# Patient Record
Sex: Female | Born: 1960 | Race: White | Hispanic: No | Marital: Married | Smoking: Current every day smoker
Health system: Southern US, Community
[De-identification: ages and names within clinical notes are randomized; demographics above are authoritative.]

## PROBLEM LIST (undated history)

## (undated) DIAGNOSIS — F329 Major depressive disorder, single episode, unspecified: Secondary | ICD-10-CM

## (undated) DIAGNOSIS — F32A Depression, unspecified: Secondary | ICD-10-CM

## (undated) DIAGNOSIS — M1990 Unspecified osteoarthritis, unspecified site: Secondary | ICD-10-CM

## (undated) DIAGNOSIS — E039 Hypothyroidism, unspecified: Secondary | ICD-10-CM

## (undated) DIAGNOSIS — K219 Gastro-esophageal reflux disease without esophagitis: Secondary | ICD-10-CM

## (undated) HISTORY — DX: Hypothyroidism, unspecified: E03.9

## (undated) HISTORY — DX: Gastro-esophageal reflux disease without esophagitis: K21.9

## (undated) HISTORY — DX: Depression, unspecified: F32.A

## (undated) HISTORY — PX: TUBAL LIGATION: SHX77

## (undated) HISTORY — PX: COLONOSCOPY: SHX174

## (undated) HISTORY — DX: Major depressive disorder, single episode, unspecified: F32.9

## (undated) HISTORY — PX: UPPER GASTROINTESTINAL ENDOSCOPY: SHX188

---

## 1998-08-10 ENCOUNTER — Other Ambulatory Visit: Admission: RE | Admit: 1998-08-10 | Discharge: 1998-08-10 | Payer: Self-pay | Admitting: Gynecology

## 1999-08-23 ENCOUNTER — Other Ambulatory Visit: Admission: RE | Admit: 1999-08-23 | Discharge: 1999-08-23 | Payer: Self-pay | Admitting: Gynecology

## 1999-08-24 ENCOUNTER — Encounter: Admission: RE | Admit: 1999-08-24 | Discharge: 1999-08-24 | Payer: Self-pay | Admitting: Gynecology

## 2000-08-25 ENCOUNTER — Other Ambulatory Visit: Admission: RE | Admit: 2000-08-25 | Discharge: 2000-08-25 | Payer: Self-pay | Admitting: Gynecology

## 2000-12-02 ENCOUNTER — Other Ambulatory Visit: Admission: RE | Admit: 2000-12-02 | Discharge: 2000-12-02 | Payer: Self-pay | Admitting: Gynecology

## 2001-02-04 ENCOUNTER — Encounter: Admission: RE | Admit: 2001-02-04 | Discharge: 2001-02-04 | Payer: Self-pay | Admitting: Gynecology

## 2001-02-04 ENCOUNTER — Encounter: Payer: Self-pay | Admitting: Gynecology

## 2001-10-13 ENCOUNTER — Other Ambulatory Visit: Admission: RE | Admit: 2001-10-13 | Discharge: 2001-10-13 | Payer: Self-pay | Admitting: Gynecology

## 2002-10-18 ENCOUNTER — Other Ambulatory Visit: Admission: RE | Admit: 2002-10-18 | Discharge: 2002-10-18 | Payer: Self-pay | Admitting: Gynecology

## 2003-10-20 ENCOUNTER — Other Ambulatory Visit: Admission: RE | Admit: 2003-10-20 | Discharge: 2003-10-20 | Payer: Self-pay | Admitting: Gynecology

## 2003-11-02 ENCOUNTER — Encounter: Admission: RE | Admit: 2003-11-02 | Discharge: 2003-11-02 | Payer: Self-pay | Admitting: Gynecology

## 2004-04-23 ENCOUNTER — Other Ambulatory Visit: Admission: RE | Admit: 2004-04-23 | Discharge: 2004-04-23 | Payer: Self-pay | Admitting: Gynecology

## 2004-11-08 ENCOUNTER — Other Ambulatory Visit: Admission: RE | Admit: 2004-11-08 | Discharge: 2004-11-08 | Payer: Self-pay | Admitting: Gynecology

## 2004-11-23 ENCOUNTER — Encounter: Admission: RE | Admit: 2004-11-23 | Discharge: 2004-11-23 | Payer: Self-pay | Admitting: Gynecology

## 2005-05-15 ENCOUNTER — Other Ambulatory Visit: Admission: RE | Admit: 2005-05-15 | Discharge: 2005-05-15 | Payer: Self-pay | Admitting: Gynecology

## 2005-11-25 ENCOUNTER — Other Ambulatory Visit: Admission: RE | Admit: 2005-11-25 | Discharge: 2005-11-25 | Payer: Self-pay | Admitting: Gynecology

## 2005-12-02 ENCOUNTER — Encounter: Admission: RE | Admit: 2005-12-02 | Discharge: 2005-12-02 | Payer: Self-pay | Admitting: Gynecology

## 2006-06-02 ENCOUNTER — Other Ambulatory Visit: Admission: RE | Admit: 2006-06-02 | Discharge: 2006-06-02 | Payer: Self-pay | Admitting: Gynecology

## 2006-07-17 ENCOUNTER — Ambulatory Visit: Payer: Self-pay | Admitting: Gastroenterology

## 2006-07-23 ENCOUNTER — Ambulatory Visit: Payer: Self-pay | Admitting: Gastroenterology

## 2006-08-22 ENCOUNTER — Ambulatory Visit: Payer: Self-pay | Admitting: Gastroenterology

## 2006-12-11 ENCOUNTER — Encounter: Admission: RE | Admit: 2006-12-11 | Discharge: 2006-12-11 | Payer: Self-pay | Admitting: Gynecology

## 2007-06-10 ENCOUNTER — Other Ambulatory Visit: Admission: RE | Admit: 2007-06-10 | Discharge: 2007-06-10 | Payer: Self-pay | Admitting: Gynecology

## 2008-01-04 ENCOUNTER — Encounter: Admission: RE | Admit: 2008-01-04 | Discharge: 2008-01-04 | Payer: Self-pay | Admitting: Gynecology

## 2008-07-12 ENCOUNTER — Telehealth: Payer: Self-pay | Admitting: Gastroenterology

## 2008-07-13 ENCOUNTER — Ambulatory Visit: Payer: Self-pay | Admitting: Gastroenterology

## 2009-02-24 ENCOUNTER — Encounter: Admission: RE | Admit: 2009-02-24 | Discharge: 2009-02-24 | Payer: Self-pay | Admitting: Gynecology

## 2010-03-06 ENCOUNTER — Encounter: Admission: RE | Admit: 2010-03-06 | Discharge: 2010-03-06 | Payer: Self-pay | Admitting: Gynecology

## 2011-02-05 ENCOUNTER — Other Ambulatory Visit: Payer: Self-pay | Admitting: Gynecology

## 2011-02-05 DIAGNOSIS — Z1231 Encounter for screening mammogram for malignant neoplasm of breast: Secondary | ICD-10-CM

## 2011-03-08 ENCOUNTER — Ambulatory Visit
Admission: RE | Admit: 2011-03-08 | Discharge: 2011-03-08 | Disposition: A | Discharge status: Home / Self Care | Payer: PRIVATE HEALTH INSURANCE | Source: Ambulatory Visit | Attending: Gynecology | Admitting: Gynecology

## 2011-03-08 DIAGNOSIS — Z1231 Encounter for screening mammogram for malignant neoplasm of breast: Secondary | ICD-10-CM

## 2011-03-29 NOTE — Assessment & Plan Note (Signed)
[facility]                           GASTROENTEROLOGY OFFICE NOTE   NAME:Hambly, KENITA BINES                  MRN:          [id number]  DATE:08/22/2006                            DOB:          Dec 06, 1960    Ms. Frink has had complete relief of her symptoms since starting  metoclopramide and taking Prevacid twice a day. She has no gastrointestinal  complaints today.   CURRENT MEDICATIONS:  As listed on the chart;  updated and reviewed.   MEDICATION ALLERGIES:  None known.   PHYSICAL EXAMINATION:  In no acute distress. Weight: 147.6 pounds. Blood  pressure: 120/72. Pulse: 80 and regular.  CHEST: Clear to auscultation bilaterally.  CARDIAC: Regular rate and rhythm without murmurs appreciated.  ABDOMEN: Soft and nontender with normoactive bowel sounds.   ASSESSMENT/PLAN:  Refractory gastroesophageal reflux disease. Maintain  strict anti-reflux measures and a low-fat diet. Continue Prevacid 30 mg p.o.  b.i.d. taken 30 minutes before breakfast and dinner. She is to begin  tapering the use of metoclopramide and using it only two or three times a  day and she may completely discontinue it within in the next few weeks if  her symptoms remain under control on the above dietary measures and Prevacid  b.i.d. Return office visit in three months.       Venita Lick. Russella Dar, MD, [facility]      MTS/MedQ  DD:  08/25/2006  DT:  08/25/2006  Job #:  [id number]   cc:   Gretta Cool, M.D.

## 2011-03-29 NOTE — Assessment & Plan Note (Signed)
[facility]                           GASTROENTEROLOGY OFFICE NOTE   NAME:Bernabei, GITTY OSTERLUND                  MRN:          [id number]  DATE:07/17/2006                            DOB:          04-Jan-1961    REFERRING PHYSICIAN:  Gretta Cool, M.D.   REASON FOR REFERRAL:  Left upper quadrant pain, reflux symptoms, and  diarrhea.   HISTORY OF PRESENT ILLNESS:  Mrs. Blackwelder has a history of a duodenal ulcer,  diagnosed about 10 years ago. She has generally done well without any  significant gastrointestinal problems until this past July. She developed  acute diarrhea after eating at a salad bar at the beach this summer. One  other person with her had the same symptoms. She has had persistent problems  with loose stools, gas, bloating, and left upper quadrant pain as well as a  worsening and postprandial heartburn and reflux symptoms. She states she was  treated with a course of prednisone for lower back pain in July as well. She  has been treated with Prevacid for about the past 4 weeks and her reflux  symptoms have generally been better, however, the abdominal pain, gas,  bloating and intermittent loose stools have not improved. She was treated  for a urinary tract infection with a 10 day course of nitrofurantoin as  well. She notes no dysphagia, odynophagia, melena, hematochezia or weight  loss. Her mother has a history of colon polyps. No other family members with  colon polyps, colon cancer, or inflammatory bowel disease. She states she  had colonoscopy and upper endoscopy performed about 10 years ago but she  does not recall the findings, where the procedures were performed, or who  performed these examinations.   PAST MEDICAL HISTORY:  Hypothyroidism, depression, history of a duodenal  ulcer, status post bilateral tubal ligation.   MEDICATIONS:  Listed separately on the maintenance medication sheet.   ALLERGIES:  NO KNOWN DRUG  ALLERGIES.   SOCIAL HISTORY/REVIEW OF SYSTEMS:  Per the gastroenterology evaluation form.   PHYSICAL EXAMINATION:  GENERAL:  No acute distress.  VITAL SIGNS:  Height 5 feet, 5 inches. Weight 151.2 pounds. Blood pressure  124/70, pulse 72 and regular.  HEENT:  Anicteric sclerae. Oropharynx clear.  CHEST:  Clear to auscultation bilaterally.  CARDIAC:  Regular rate and rhythm. Without murmurs.  ABDOMEN:  Soft with minimal left upper quadrant tenderness to deep  palpation. No rebound or guarding. No palpable organomegaly, masses, or  hernias. Normal active bowel sounds.  RECTAL:  Examination deferred.  EXTREMITIES:  Without clubbing, cyanosis, or edema.  NEUROLOGIC:  Alert and oriented times three. Grossly non-focal.   ASSESSMENT/PLAN:  Left upper quadrant pain, intermittent diarrhea, and  reflux symptoms.   PLAN:  Obtain a CBC, C-met, lipase, and stool Hemoccult's. Continue Prevacid  until her current prescription is completed and then she may change to  Prilosec OTC, 1 p.o. q. a.m. Continue standard anti-reflux measures. Begin  Robinul Forte 1 p.o. b.i.d. If her symptoms do not resolve within the next  few weeks, will plan on further evaluation with upper endoscopy and  colonoscopy.  Venita Lick. Russella Dar, MD, [facility]   MTS/MedQ  DD:  07/21/2006  DT:  07/21/2006  Job #:  [id number]

## 2011-05-10 ENCOUNTER — Telehealth: Payer: Self-pay | Admitting: Gastroenterology

## 2011-05-10 NOTE — Telephone Encounter (Signed)
Patient is at the beach.  She c/o abdominal pain and diarrhea.  She states she had an ulcer a few years ago.  She is not taking a PPI.  I have asked her to try Prilosec OTC one po q am, I have also asked her to try imodium and align.  She will call me next week when she returns from the beach if still having problems to be worked in with the extender.

## 2011-05-13 ENCOUNTER — Telehealth: Payer: Self-pay | Admitting: Gastroenterology

## 2011-05-13 NOTE — Telephone Encounter (Signed)
Patient still having abdominal pain.  I will have her come in and see Mike Gip PA tomorrow at 9:00.

## 2011-05-14 ENCOUNTER — Ambulatory Visit (INDEPENDENT_AMBULATORY_CARE_PROVIDER_SITE_OTHER): Payer: PRIVATE HEALTH INSURANCE | Admitting: Physician Assistant

## 2011-05-14 ENCOUNTER — Other Ambulatory Visit: Payer: PRIVATE HEALTH INSURANCE

## 2011-05-14 ENCOUNTER — Other Ambulatory Visit (INDEPENDENT_AMBULATORY_CARE_PROVIDER_SITE_OTHER): Payer: PRIVATE HEALTH INSURANCE

## 2011-05-14 ENCOUNTER — Encounter: Payer: Self-pay | Admitting: Physician Assistant

## 2011-05-14 VITALS — BP 104/70 | HR 84 | Ht 65.0 in | Wt 150.4 lb

## 2011-05-14 DIAGNOSIS — R197 Diarrhea, unspecified: Secondary | ICD-10-CM

## 2011-05-14 DIAGNOSIS — R110 Nausea: Secondary | ICD-10-CM

## 2011-05-14 DIAGNOSIS — E039 Hypothyroidism, unspecified: Secondary | ICD-10-CM

## 2011-05-14 DIAGNOSIS — Z8719 Personal history of other diseases of the digestive system: Secondary | ICD-10-CM

## 2011-05-14 LAB — COMPREHENSIVE METABOLIC PANEL
ALT: 15 U/L (ref 0–35)
AST: 20 U/L (ref 0–37)
Albumin: 3.9 g/dL (ref 3.5–5.2)
CO2: 29 mEq/L (ref 19–32)
Calcium: 9.1 mg/dL (ref 8.4–10.5)
GFR: 95.18 mL/min (ref >60.00)
Glucose, Bld: 93 mg/dL (ref 70–99)
Potassium: 4.2 mEq/L (ref 3.5–5.1)
Total Bilirubin: 0.2 mg/dL — ABNORMAL LOW (ref 0.3–1.2)
Total Protein: 6.7 g/dL (ref 6.0–8.3)

## 2011-05-14 LAB — CBC WITH DIFFERENTIAL/PLATELET
Basophils Relative: 0.3 % (ref 0.0–3.0)
Eosinophils Relative: 2 % (ref 0.0–5.0)
Lymphs Abs: 2.3 10*3/uL (ref 0.7–4.0)
Monocytes Absolute: 0.9 10*3/uL (ref 0.1–1.0)
Neutro Abs: 9.3 10*3/uL — ABNORMAL HIGH (ref 1.4–7.7)
Platelets: 403 10*3/uL — ABNORMAL HIGH (ref 150.0–400.0)
WBC: 12.9 10*3/uL — ABNORMAL HIGH (ref 4.5–10.5)

## 2011-05-14 NOTE — Progress Notes (Signed)
Agree with initial assessment and plans 

## 2011-05-14 NOTE — Patient Instructions (Signed)
Please go to the basement level to have your labs drawn.  Continue the Prilosec 20 mg take 1 daily for 1 month. Continue the Align Probiotic, take 1 capsule daily for 1 month.

## 2011-05-14 NOTE — Progress Notes (Signed)
Subjective:    Patient ID: Kimberly Pacheco, female    DOB: [birth year], 50 y.o.   MRN: [id number]  HPI Kimberly Pacheco is a pleasant 50 year old female known to Dr. Russella Dar. She was last seen in 2007 at which time she was having some problems with diarrhea and left upper quadrant pain. She has a remote history of a duodenal ulcer. She underwent upper endoscopy in September of 2007 which was a normal exam.  She comes in today as an acute add on ,with onset of her current symptoms about 3 weeks ago. She notes that every time she drinks beer within the past month or so she has become acutely all with nausea and abdominal pain. She says she's not drinking very much perhaps one or 2 beers and has always tolerated this in the past.  Her family is no questioning if she is intolerant to gluten. She did take a course of Zithromax mid May 2012. She had onset last week while vacationing at the beach with nausea abdominal pain and diarrhea with multiple watery bowel movements per day which lasted for about 2-1/2 days. She called here on Friday 6/29 and was asked to start Prilosec 20 mg daily and Align one daily. She has also been taking Imodium on her regular basis. She says over this past weekend she had gradually improved with Imodium and is now able to at least tolerate bland foods. She continues with nausea has not had any vomiting but has the rolling unsettled feeling in her abdomen. She has not had any documented fever or chills. No other family members have been ill she has not had any other known exposures. She is not on any aspirin or NSAIDs. Yesterday she took 2 Imodium and only had a couple of bowel movements, this morning  she has had  one semi-formed stool so far. She also mentions that she has an intolerance to MSG.   Review of Systems  Constitutional: Positive for appetite change.  HENT: Negative.   Eyes: Negative.   Respiratory: Negative.   Cardiovascular: Negative.   Gastrointestinal: Positive for  nausea, abdominal pain and diarrhea.  Genitourinary: Negative.   Musculoskeletal: Negative.   Skin: Negative.   Neurological: Negative.   Hematological: Negative.   Psychiatric/Behavioral: Negative.         Objective:   Physical Exam Well-developed white female in no acute distress, pleasant, alert and oriented x3 HEENT;, , nontraumatic normocephalic EOMI PERRLA sclera anicteric  Neck ;supple no JVD  Cardiovascular; regular rate and rhythm with S1-S2 no murmur rub or gallop, Pulmonary; clear bilaterally,  Abdomen; soft mildly tender in the left upper left mid and left lower quadrant no guarding no rebound no probable mass or hepatosplenomegaly bowel sounds active  Rectal; not done Extremities benign no rash no edema  pSYCH; mood and affect normal and appropriate        Assessment & Plan:  #38 50 year old female with acute diarrheal illness, associated with nausea and left-sided abdominal pain, currently improving. Etiology is not clear rule out foodborne infectious gastroenteritis rule out viral gastroenteritis, rule out antibiotic associated diarrhea i.e. C. difficile, doubt celiac disease but will rule out.  Plan CBC CRP celiac panel a day Stool cultures and stool for C. difficile PCR Continue Prilosec 20 mg once daily to complete 30 days Continue Align one daily x30 days Further plans pending results of labs and stool studies.  #2 Colon neoplasia screening Patient relates remote colonoscopy, 20 years ago. Discussed a repeat screening due to  age and she will schedule later this year.

## 2011-05-15 LAB — FECAL LACTOFERRIN, QUANT: Lactoferrin: POSITIVE

## 2011-05-16 ENCOUNTER — Encounter: Payer: Self-pay | Admitting: *Deleted

## 2011-05-16 ENCOUNTER — Telehealth: Payer: Self-pay | Admitting: Physician Assistant

## 2011-05-16 LAB — RETICULIN ANTIBODIES, IGA W TITER

## 2011-05-16 LAB — GLIADIN ANTIBODIES, SERUM: Gliadin IgA: 5.3 U/mL (ref <20)

## 2011-05-16 LAB — GIARDIA/CRYPTOSPORIDIUM (EIA): Cryptosporidium Screen (EIA): NEGATIVE

## 2011-05-16 LAB — TISSUE TRANSGLUTAMINASE, IGA: Tissue Transglutaminase Ab, IgA: 6.1 U/mL (ref <20)

## 2011-05-16 NOTE — Telephone Encounter (Signed)
Patient calling to report a good day on Tues but states she had diarrhea yesterday and had to take Imodium two times. Today, is a good day so far. She also wants [place] to know she had her labs and left a stool sample on Tuesday. Told patient some of the lab results as pending and might be back later today or tomorrow.

## 2011-05-16 NOTE — Telephone Encounter (Signed)
PLEASE LET PT KNOW HER WHITE COUNT IS MILDLY ELEVATED,CELIAC LABS (GLUTEN ALLERGY) ARE NORMAL. STOOL CULTURES ARE ALL NEGATIVE, BUT HAS WHITE CELLS INHER STOOL. sTILL MAY BE INFECTIOUS  i THINK SHE IS DUE FOR A COLONOSCOPY AND WE OUGHT TO GO AHEAD AND GET THAT SCHEDULED. NO ANTIBIOTIC FOR NOW.   THANKS

## 2011-05-16 NOTE — Telephone Encounter (Signed)
Agree with plans as outlined.

## 2011-05-16 NOTE — Telephone Encounter (Signed)
Patient given results as per Mike Gip, PA. Patient scheduled for colonoscopy on 06/12/11 arrival at 3:00 PM for 4:00 PM procedure. Pre visit on 06/05/11 at 10:00 AM. Letter mailed for pre visit. Patient to call if she has further problems or diarrhea does not resolve.

## 2011-05-18 ENCOUNTER — Emergency Department (HOSPITAL_COMMUNITY): Payer: BC Managed Care – PPO

## 2011-05-18 ENCOUNTER — Observation Stay (HOSPITAL_COMMUNITY)
Admission: EM | Admit: 2011-05-18 | Discharge: 2011-05-18 | Disposition: A | Discharge status: Home / Self Care | Payer: BC Managed Care – PPO | Attending: Emergency Medicine | Admitting: Emergency Medicine

## 2011-05-18 DIAGNOSIS — R109 Unspecified abdominal pain: Principal | ICD-10-CM | POA: Insufficient documentation

## 2011-05-18 DIAGNOSIS — R1012 Left upper quadrant pain: Secondary | ICD-10-CM | POA: Insufficient documentation

## 2011-05-18 DIAGNOSIS — R1013 Epigastric pain: Secondary | ICD-10-CM | POA: Insufficient documentation

## 2011-05-18 DIAGNOSIS — R112 Nausea with vomiting, unspecified: Secondary | ICD-10-CM | POA: Insufficient documentation

## 2011-05-18 LAB — COMPREHENSIVE METABOLIC PANEL
Albumin: 3.7 g/dL (ref 3.5–5.2)
Alkaline Phosphatase: 115 U/L (ref 39–117)
BUN: 6 mg/dL (ref 6–23)
CO2: 25 mEq/L (ref 19–32)
Chloride: 104 mEq/L (ref 96–112)
Creatinine, Ser: 0.58 mg/dL (ref 0.50–1.10)
GFR calc Af Amer: >60 mL/min (ref >60)
GFR calc non Af Amer: >60 mL/min (ref >60)
Glucose, Bld: 107 mg/dL — ABNORMAL HIGH (ref 70–99)
Potassium: 3.9 mEq/L (ref 3.5–5.1)
Total Bilirubin: 0.2 mg/dL — ABNORMAL LOW (ref 0.3–1.2)

## 2011-05-18 LAB — CBC
HCT: 45.8 % (ref 36.0–46.0)
Hemoglobin: 15.9 g/dL — ABNORMAL HIGH (ref 12.0–15.0)
MCV: 92 fL (ref 78.0–100.0)
WBC: 16.3 10*3/uL — ABNORMAL HIGH (ref 4.0–10.5)

## 2011-05-18 LAB — DIFFERENTIAL
Eosinophils Relative: 3 % (ref 0–5)
Lymphs Abs: 2.7 10*3/uL (ref 0.7–4.0)
Monocytes Absolute: 0.9 10*3/uL (ref 0.1–1.0)
Neutro Abs: 12 10*3/uL — ABNORMAL HIGH (ref 1.7–7.7)
Neutrophils Relative %: 74 % (ref 43–77)

## 2011-05-18 LAB — LIPASE, BLOOD: Lipase: 44 U/L (ref 11–59)

## 2011-05-20 ENCOUNTER — Telehealth: Payer: Self-pay | Admitting: Gastroenterology

## 2011-05-20 ENCOUNTER — Ambulatory Visit (AMBULATORY_SURGERY_CENTER): Payer: BC Managed Care – PPO | Admitting: *Deleted

## 2011-05-20 DIAGNOSIS — Z1211 Encounter for screening for malignant neoplasm of colon: Secondary | ICD-10-CM

## 2011-05-20 DIAGNOSIS — R197 Diarrhea, unspecified: Secondary | ICD-10-CM

## 2011-05-20 MED ORDER — PEG-KCL-NACL-NASULF-NA ASC-C 100 G PO SOLR: ORAL | Status: DC

## 2011-05-20 NOTE — Progress Notes (Signed)
PER PATIENT WENT TO [facility] FOR VOMITING AND DIARRHEA. XRAY'S DONE AND BLOOD WORK.

## 2011-05-20 NOTE — Telephone Encounter (Signed)
Patient has had 2 very good days.  She was asking about an earlier date for a colon.  She will come in and have her pre-visit today and a colon tomorrow. She is advised to be on a clear liquid diet for the remainder of the the day.

## 2011-05-21 ENCOUNTER — Ambulatory Visit (AMBULATORY_SURGERY_CENTER): Payer: BC Managed Care – PPO | Admitting: Gastroenterology

## 2011-05-21 ENCOUNTER — Encounter: Payer: Self-pay | Admitting: Gastroenterology

## 2011-05-21 VITALS — BP 139/61 | HR 73 | Temp 98.2°F | Resp 16 | Ht 65.0 in | Wt 148.0 lb

## 2011-05-21 DIAGNOSIS — D126 Benign neoplasm of colon, unspecified: Secondary | ICD-10-CM

## 2011-05-21 DIAGNOSIS — R197 Diarrhea, unspecified: Secondary | ICD-10-CM

## 2011-05-21 MED ORDER — SODIUM CHLORIDE 0.9 % IV SOLN: 500.0000 mL | INTRAVENOUS | Status: DC

## 2011-05-21 NOTE — Patient Instructions (Signed)
Discharge instructions given with verbal understanding. Handout on polyp given. Resume previous medications.

## 2011-05-28 ENCOUNTER — Encounter: Payer: Self-pay | Admitting: Gastroenterology

## 2011-06-14 ENCOUNTER — Other Ambulatory Visit: Payer: PRIVATE HEALTH INSURANCE | Admitting: Gastroenterology

## 2011-10-31 ENCOUNTER — Other Ambulatory Visit: Payer: Self-pay | Admitting: Gynecology

## 2012-02-17 ENCOUNTER — Other Ambulatory Visit: Payer: Self-pay | Admitting: Gynecology

## 2012-02-17 DIAGNOSIS — Z1231 Encounter for screening mammogram for malignant neoplasm of breast: Secondary | ICD-10-CM

## 2012-03-09 ENCOUNTER — Ambulatory Visit: Payer: BC Managed Care – PPO

## 2012-03-12 ENCOUNTER — Ambulatory Visit
Admission: RE | Admit: 2012-03-12 | Discharge: 2012-03-12 | Disposition: A | Discharge status: Home / Self Care | Payer: BC Managed Care – PPO | Source: Ambulatory Visit | Attending: Gynecology | Admitting: Gynecology

## 2012-03-12 DIAGNOSIS — Z1231 Encounter for screening mammogram for malignant neoplasm of breast: Secondary | ICD-10-CM

## 2012-03-17 ENCOUNTER — Other Ambulatory Visit: Payer: Self-pay | Admitting: Gynecology

## 2012-03-17 DIAGNOSIS — R928 Other abnormal and inconclusive findings on diagnostic imaging of breast: Secondary | ICD-10-CM

## 2012-03-19 ENCOUNTER — Ambulatory Visit
Admission: RE | Admit: 2012-03-19 | Discharge: 2012-03-19 | Disposition: A | Discharge status: Home / Self Care | Payer: BC Managed Care – PPO | Source: Ambulatory Visit | Attending: Gynecology | Admitting: Gynecology

## 2012-03-19 DIAGNOSIS — R928 Other abnormal and inconclusive findings on diagnostic imaging of breast: Secondary | ICD-10-CM

## 2012-06-12 ENCOUNTER — Other Ambulatory Visit: Payer: Self-pay | Admitting: Orthopedic Surgery

## 2012-06-25 ENCOUNTER — Encounter (HOSPITAL_BASED_OUTPATIENT_CLINIC_OR_DEPARTMENT_OTHER): Payer: Self-pay | Admitting: *Deleted

## 2012-06-25 NOTE — Progress Notes (Signed)
No labs needed

## 2012-06-30 ENCOUNTER — Encounter (HOSPITAL_BASED_OUTPATIENT_CLINIC_OR_DEPARTMENT_OTHER): Payer: Self-pay | Admitting: Orthopedic Surgery

## 2012-06-30 ENCOUNTER — Ambulatory Visit (HOSPITAL_BASED_OUTPATIENT_CLINIC_OR_DEPARTMENT_OTHER)
Admission: RE | Admit: 2012-06-30 | Discharge: 2012-06-30 | Disposition: A | Discharge status: Home / Self Care | Payer: BC Managed Care – PPO | Source: Ambulatory Visit | Attending: Orthopedic Surgery | Admitting: Orthopedic Surgery

## 2012-06-30 ENCOUNTER — Encounter (HOSPITAL_BASED_OUTPATIENT_CLINIC_OR_DEPARTMENT_OTHER)
Admission: RE | Disposition: A | Discharge status: Home / Self Care | Payer: Self-pay | Source: Ambulatory Visit | Attending: Orthopedic Surgery

## 2012-06-30 ENCOUNTER — Encounter (HOSPITAL_BASED_OUTPATIENT_CLINIC_OR_DEPARTMENT_OTHER): Payer: Self-pay | Admitting: Anesthesiology

## 2012-06-30 ENCOUNTER — Ambulatory Visit (HOSPITAL_BASED_OUTPATIENT_CLINIC_OR_DEPARTMENT_OTHER): Payer: BC Managed Care – PPO | Admitting: Anesthesiology

## 2012-06-30 ENCOUNTER — Encounter (HOSPITAL_BASED_OUTPATIENT_CLINIC_OR_DEPARTMENT_OTHER): Payer: Self-pay | Admitting: *Deleted

## 2012-06-30 DIAGNOSIS — K219 Gastro-esophageal reflux disease without esophagitis: Secondary | ICD-10-CM | POA: Insufficient documentation

## 2012-06-30 DIAGNOSIS — M6740 Ganglion, unspecified site: Secondary | ICD-10-CM | POA: Insufficient documentation

## 2012-06-30 DIAGNOSIS — E039 Hypothyroidism, unspecified: Secondary | ICD-10-CM | POA: Insufficient documentation

## 2012-06-30 HISTORY — PX: GANGLION CYST EXCISION: SHX1691

## 2012-06-30 HISTORY — DX: Unspecified osteoarthritis, unspecified site: M19.90

## 2012-06-30 LAB — POCT HEMOGLOBIN-HEMACUE: Hemoglobin: 15.2 g/dL — ABNORMAL HIGH (ref 12.0–15.0)

## 2012-06-30 SURGERY — EXCISION, GANGLION CYST, WRIST
Anesthesia: General | Site: Wrist | Laterality: Left | Wound class: Clean

## 2012-06-30 MED ORDER — OXYCODONE HCL 5 MG PO TABS: 5.0000 mg | ORAL_TABLET | Freq: Once | ORAL | Status: DC | PRN

## 2012-06-30 MED ORDER — BUPIVACAINE HCL (PF) 0.25 % IJ SOLN
INTRAMUSCULAR | Status: DC | PRN
  Administered 2012-06-30: 6 mL

## 2012-06-30 MED ORDER — HYDROCODONE-ACETAMINOPHEN 5-500 MG PO TABS: 1.0000 | ORAL_TABLET | ORAL | Status: AC | PRN

## 2012-06-30 MED ORDER — CHLORHEXIDINE GLUCONATE 4 % EX LIQD: 60.0000 mL | Freq: Once | CUTANEOUS | Status: DC

## 2012-06-30 MED ORDER — LACTATED RINGERS IV SOLN
INTRAVENOUS | Status: DC
  Administered 2012-06-30 (×2): via INTRAVENOUS

## 2012-06-30 MED ORDER — OXYCODONE HCL 5 MG/5ML PO SOLN: 5.0000 mg | Freq: Once | ORAL | Status: DC | PRN

## 2012-06-30 MED ORDER — MIDAZOLAM HCL 5 MG/5ML IJ SOLN
INTRAMUSCULAR | Status: DC | PRN
  Administered 2012-06-30: 2 mg via INTRAVENOUS

## 2012-06-30 MED ORDER — DEXAMETHASONE SODIUM PHOSPHATE 4 MG/ML IJ SOLN
INTRAMUSCULAR | Status: DC | PRN
  Administered 2012-06-30: 10 mg via INTRAVENOUS

## 2012-06-30 MED ORDER — METOCLOPRAMIDE HCL 5 MG/ML IJ SOLN
INTRAMUSCULAR | Status: DC | PRN
  Administered 2012-06-30: 10 mg via INTRAVENOUS

## 2012-06-30 MED ORDER — PROPOFOL 10 MG/ML IV EMUL
INTRAVENOUS | Status: DC | PRN
  Administered 2012-06-30: 200 mg via INTRAVENOUS
  Administered 2012-06-30 (×2): 25 mg via INTRAVENOUS

## 2012-06-30 MED ORDER — FENTANYL CITRATE 0.05 MG/ML IJ SOLN
25.0000 ug | INTRAMUSCULAR | Status: DC | PRN
  Administered 2012-06-30: 25 ug via INTRAVENOUS
  Administered 2012-06-30: 50 ug via INTRAVENOUS

## 2012-06-30 MED ORDER — ONDANSETRON HCL 4 MG/2ML IJ SOLN
INTRAMUSCULAR | Status: DC | PRN
  Administered 2012-06-30: 4 mg via INTRAVENOUS

## 2012-06-30 MED ORDER — DEXTROSE 5 % IV SOLN
3.0000 g | INTRAVENOUS | Status: AC
  Administered 2012-06-30: 2 g via INTRAVENOUS

## 2012-06-30 MED ORDER — LIDOCAINE HCL (CARDIAC) 20 MG/ML IV SOLN
INTRAVENOUS | Status: DC | PRN
  Administered 2012-06-30: 50 mg via INTRAVENOUS

## 2012-06-30 MED ORDER — METOCLOPRAMIDE HCL 5 MG/ML IJ SOLN: 10.0000 mg | Freq: Once | INTRAMUSCULAR | Status: DC | PRN

## 2012-06-30 SURGICAL SUPPLY — 42 items
BANDAGE GAUZE ELAST BULKY 4 IN (GAUZE/BANDAGES/DRESSINGS) ×2 IMPLANT
BLADE MINI RND TIP GREEN BEAV (BLADE) IMPLANT
BLADE SURG 15 STRL LF DISP TIS (BLADE) ×1 IMPLANT
BLADE SURG 15 STRL SS (BLADE) ×2
BNDG CMPR 9X4 STRL LF SNTH (GAUZE/BANDAGES/DRESSINGS) ×1
BNDG COHESIVE 3X5 TAN STRL LF (GAUZE/BANDAGES/DRESSINGS) ×2 IMPLANT
BNDG ESMARK 4X9 LF (GAUZE/BANDAGES/DRESSINGS) ×1 IMPLANT
CHLORAPREP W/TINT 26ML (MISCELLANEOUS) ×2 IMPLANT
CLOTH BEACON ORANGE TIMEOUT ST (SAFETY) ×2 IMPLANT
CORDS BIPOLAR (ELECTRODE) ×2 IMPLANT
COVER MAYO STAND STRL (DRAPES) ×2 IMPLANT
COVER TABLE BACK 60X90 (DRAPES) ×2 IMPLANT
CUFF TOURNIQUET SINGLE 18IN (TOURNIQUET CUFF) ×1 IMPLANT
DECANTER SPIKE VIAL GLASS SM (MISCELLANEOUS) IMPLANT
DRAPE EXTREMITY T 121X128X90 (DRAPE) ×2 IMPLANT
DRAPE SURG 17X23 STRL (DRAPES) ×2 IMPLANT
GAUZE XEROFORM 1X8 LF (GAUZE/BANDAGES/DRESSINGS) ×2 IMPLANT
GLOVE BIO SURGEON STRL SZ 6.5 (GLOVE) ×2 IMPLANT
GLOVE BIOGEL PI IND STRL 7.0 (GLOVE) IMPLANT
GLOVE BIOGEL PI INDICATOR 7.0 (GLOVE) ×1
GLOVE SURG ORTHO 8.0 STRL STRW (GLOVE) ×2 IMPLANT
GOWN BRE IMP PREV XXLGXLNG (GOWN DISPOSABLE) ×2 IMPLANT
GOWN PREVENTION PLUS XLARGE (GOWN DISPOSABLE) ×2 IMPLANT
NEEDLE 27GAX1X1/2 (NEEDLE) ×2 IMPLANT
NS IRRIG 1000ML POUR BTL (IV SOLUTION) ×2 IMPLANT
PACK BASIN DAY SURGERY FS (CUSTOM PROCEDURE TRAY) ×2 IMPLANT
PAD CAST 3X4 CTTN HI CHSV (CAST SUPPLIES) ×1 IMPLANT
PADDING CAST ABS 4INX4YD NS (CAST SUPPLIES)
PADDING CAST ABS COTTON 4X4 ST (CAST SUPPLIES) ×1 IMPLANT
PADDING CAST COTTON 3X4 STRL (CAST SUPPLIES) ×2
SPLINT PLASTER CAST XFAST 3X15 (CAST SUPPLIES) ×5 IMPLANT
SPLINT PLASTER XTRA FASTSET 3X (CAST SUPPLIES) ×5
SPONGE GAUZE 4X4 12PLY (GAUZE/BANDAGES/DRESSINGS) ×2 IMPLANT
STOCKINETTE 4X48 STRL (DRAPES) ×2 IMPLANT
SUT VIC AB 4-0 P2 18 (SUTURE) IMPLANT
SUT VICRYL 4-0 PS2 18IN ABS (SUTURE) ×1 IMPLANT
SUT VICRYL RAPIDE 4/0 PS 2 (SUTURE) ×2 IMPLANT
SYR BULB 3OZ (MISCELLANEOUS) ×2 IMPLANT
SYR CONTROL 10ML LL (SYRINGE) ×2 IMPLANT
TOWEL OR 17X24 6PK STRL BLUE (TOWEL DISPOSABLE) ×3 IMPLANT
UNDERPAD 30X30 INCONTINENT (UNDERPADS AND DIAPERS) ×1 IMPLANT
WATER STERILE IRR 1000ML POUR (IV SOLUTION) ×1 IMPLANT

## 2012-06-30 NOTE — Anesthesia Preprocedure Evaluation (Signed)
Anesthesia Evaluation  Patient identified by MRN, date of birth, ID band Patient awake    Reviewed: Allergy & Precautions, H&P , NPO status , Patient's Chart, lab work & pertinent test results, reviewed documented beta blocker date and time   Airway Mallampati: II TM Distance: >3 FB Neck ROM: full    Dental   Pulmonary neg pulmonary ROS,  breath sounds clear to auscultation        Cardiovascular negative cardio ROS  Rhythm:regular     Neuro/Psych PSYCHIATRIC DISORDERS Depression negative neurological ROS     GI/Hepatic Neg liver ROS, PUD, GERD-  Medicated and Controlled,  Endo/Other  negative endocrine ROSHypothyroidism   Renal/GU negative Renal ROS  negative genitourinary   Musculoskeletal   Abdominal   Peds  Hematology negative hematology ROS (+)   Anesthesia Other Findings See surgeon's H&P   Reproductive/Obstetrics negative OB ROS                           Anesthesia Physical Anesthesia Plan  ASA: II  Anesthesia Plan: General   Post-op Pain Management:    Induction: Intravenous  Airway Management Planned: LMA  Additional Equipment:   Intra-op Plan:   Post-operative Plan: Extubation in OR  Informed Consent: I have reviewed the patients History and Physical, chart, labs and discussed the procedure including the risks, benefits and alternatives for the proposed anesthesia with the patient or authorized representative who has indicated his/her understanding and acceptance.   Dental Advisory Given  Plan Discussed with: CRNA and Surgeon  Anesthesia Plan Comments:         Anesthesia Quick Evaluation

## 2012-06-30 NOTE — Anesthesia Postprocedure Evaluation (Signed)
Anesthesia Post Note  Patient: Kimberly Pacheco  Procedure(s) Performed: Procedure(s) (LRB): REMOVAL GANGLION OF WRIST (Left)  Anesthesia type: General  Patient location: PACU  Post pain: Pain level controlled  Post assessment: Patient's Cardiovascular Status Stable  Last Vitals:  Filed Vitals:   06/30/12 1114  BP:   Pulse:   Temp: 36.7 C  Resp:     Post vital signs: Reviewed and stable  Level of consciousness: alert  Complications: No apparent anesthesia complications

## 2012-06-30 NOTE — H&P (Signed)
Kimberly Pacheco is a 51 year-old right-hand dominant female who comes in complaining of a mass on the dorsal aspect of the left wrist since a fall in 2012.  She recalls no history of injury. She states that this is painful for her. She is desirous of having this taken care of.   She has no history of diabetes, she has history of thyroid problems, no history of arthritis or gout. There is family history of diabetes, she has been tested.  She complains of an intermittent, moderate, throbbing pain. She states it is getting worse.  It does not awaken her at night.  She is not complaining of any numbness or tingling.  Activity makes it worse, rest makes it better. She has taken Tylenol.    ALLERGIES:    None.  MEDICATIONS:    Low dose Bayer, Vivelle, Crestor, Synthroid and Sertraline.  SURGICAL HISTORY:    None.  FAMILY MEDICAL HISTORY:  Positive for diabetes, high blood pressure and arthritis.       SOCIAL HISTORY:     She smokes a pack a day and does not drink. She is a retired Runner, broadcasting/film/video.  REVIEW OF SYSTEMS:    Positive for glasses, stomach ulcer, otherwise negative 14 points.   Kimberly Pacheco is an 51 y.o. female.   Chief Complaint: Dorsal cyst left HPI: see above  Past Medical History  Diagnosis Date  . Duodenal ulcer   . Hypothyroid   . Depression   . GERD (gastroesophageal reflux disease)     off meds  . Arthritis     Past Surgical History  Procedure Date  . Tubal ligation   . Colonoscopy ?18 YRS AGO    WITH DR.STARK  . Upper gastrointestinal endoscopy     WITH DR.STARK    Family History  Problem Relation Age of Onset  . Colon polyps Mother   . Diabetes Father   . Colon cancer Neg Hx    Social History:  reports that she has been smoking.  She has never used smokeless tobacco. She reports that she does not drink alcohol or use illicit drugs.  Allergies: No Known Allergies  Medications Prior to Admission  Medication Sig Dispense Refill  . estradiol (VIVELLE-DOT) 0.05  MG/24HR Place 1 patch onto the skin once a week.        . levothyroxine (SYNTHROID, LEVOTHROID) 112 MCG tablet Take 112 mcg by mouth daily.      . progesterone (PROMETRIUM) 200 MG capsule Take 200 mg by mouth as directed.        . rosuvastatin (CRESTOR) 5 MG tablet Take 5 mg by mouth daily.       . sertraline (ZOLOFT) 50 MG tablet Take 100 mg by mouth at bedtime.       . ondansetron (ZOFRAN-ODT) 8 MG disintegrating tablet         Results for orders placed during the hospital encounter of 06/30/12 (from the past 48 hour(s))  POCT HEMOGLOBIN-HEMACUE     Status: Abnormal   Collection Time   06/30/12  7:33 AM      Component Value Range Comment   Hemoglobin 15.2 (*) 12.0 - 15.0 g/dL     No results found.   Pertinent items are noted in HPI.  Blood pressure 133/70, pulse 74, temperature 97.8 F (36.6 C), temperature source Oral, resp. rate 16, height 5\' 5"  (1.651 m), weight 159 lb 4 oz (72.235 kg), SpO2 98.00%.  General appearance: alert, cooperative and appears stated age Head:  Normocephalic, without obvious abnormality Neck: no adenopathy Resp: clear to auscultation bilaterally Cardio: regular rate and rhythm, S1, S2 normal, no murmur, click, rub or gallop GI: soft, non-tender; bowel sounds normal; no masses,  no organomegaly Extremities: extremities normal, atraumatic, no cyanosis or edema Pulses: 2+ and symmetric Skin: Skin color, texture, turgor normal. No rashes or lesions Neurologic: Grossly normal Incision/Wound: na  Assessment/Plan DIAGNOSIS:     Left wrist ganglion.  RECOMMENDATIONS/PLAN:   We have discussed the etiology of this with her along with various treatment alternatives including anti-inflammatories, splinting, observation, aspiration, clysis, surgical excision. She would like to have this surgically excised.  The pre, peri and postoperative course were discussed along with the risks and complications.  The patient is aware there is no guarantee with the surgery,  possibility of infection, recurrence, injury to arteries, nerves, tendons, incomplete relief of symptoms and dystrophy.  She is advised of a 10% recurrence rate, she has elected to have this surgically excised and this will be scheduled as an outpatient under regional anesthesia.  This is to her left wrist.   Millette Halberstam R 06/30/2012, 8:33 AM

## 2012-06-30 NOTE — Op Note (Signed)
Dictated number: [id number]

## 2012-06-30 NOTE — Brief Op Note (Signed)
06/30/2012  9:25 AM  PATIENT:  Audery Amel  51 y.o. female  PRE-OPERATIVE DIAGNOSIS:  DORSAL CYST LEFT WRIST  POST-OPERATIVE DIAGNOSIS:  DORSAL CYST LEFT WRIST  PROCEDURE:  Procedure(s) (LRB): REMOVAL GANGLION OF WRIST (Left)  SURGEON:  Surgeon(s) and Role:    * Nicki Reaper, MD - Primary  PHYSICIAN ASSISTANT:   ASSISTANTS: none   ANESTHESIA:   local and regional  EBL:  Total I/O In: 800 [I.V.:800] Out: -   BLOOD ADMINISTERED:none  DRAINS: none   LOCAL MEDICATIONS USED:  MARCAINE     SPECIMEN:  No Specimen and Excision  DISPOSITION OF SPECIMEN:  PATHOLOGY  COUNTS:  YES  TOURNIQUET:   Total Tourniquet Time Documented: Forearm (Left) - 20 minutes  DICTATION: .Other Dictation: Dictation Number [id number]  PLAN OF CARE: Discharge to home after PACU  PATIENT DISPOSITION:  PACU - hemodynamically stable.

## 2012-06-30 NOTE — Transfer of Care (Signed)
Immediate Anesthesia Transfer of Care Note  Patient: Kimberly Pacheco  Procedure(s) Performed: Procedure(s) (LRB): REMOVAL GANGLION OF WRIST (Left)  Patient Location: PACU  Anesthesia Type: General  Level of Consciousness: sedated and patient cooperative  Airway & Oxygen Therapy: Patient Spontanous Breathing and Patient connected to face mask oxygen  Post-op Assessment: Report given to PACU RN and Post -op Vital signs reviewed and stable  Post vital signs: Reviewed and stable  Complications: No apparent anesthesia complications

## 2012-06-30 NOTE — Anesthesia Procedure Notes (Signed)
Procedure Name: LMA Insertion Date/Time: 06/30/2012 8:43 AM Performed by: Gar Gibbon Pre-anesthesia Checklist: Patient identified, Emergency Drugs available, Suction available and Patient being monitored Patient Re-evaluated:Patient Re-evaluated prior to inductionOxygen Delivery Method: Circle System Utilized Preoxygenation: Pre-oxygenation with 100% oxygen Intubation Type: IV induction Ventilation: Mask ventilation without difficulty LMA: LMA inserted LMA Size: 4.0 Number of attempts: 1 Airway Equipment and Method: bite block Placement Confirmation: positive ETCO2 Tube secured with: Tape Dental Injury: Teeth and Oropharynx as per pre-operative assessment

## 2012-07-01 ENCOUNTER — Encounter (HOSPITAL_BASED_OUTPATIENT_CLINIC_OR_DEPARTMENT_OTHER): Payer: Self-pay | Admitting: Orthopedic Surgery

## 2012-07-01 NOTE — Op Note (Signed)
NAMEGAVYN, ZOSS           ACCOUNT NO.:  [id number]  MEDICAL RECORD NO.:  [id number]  LOCATION:                                 FACILITY:  PHYSICIAN:  Cindee Salt, M.D.            DATE OF BIRTH:  DATE OF PROCEDURE:  06/30/2012 DATE OF DISCHARGE:                              OPERATIVE REPORT   PREOPERATIVE DIAGNOSIS:  Dorsal wrist ganglion, left wrist.  POSTOPERATIVE DIAGNOSIS:  Dorsal wrist ganglion, left wrist.  OPERATION:  Excisional biopsy, dorsal wrist ganglion, left wrist.  SURGEON:  Cindee Salt, MD  ANESTHESIA:  General with local infiltration.  ANESTHESIOLOGIST:  Janetta Hora. Frederick, MD  HISTORY:  The patient is a 51 year old female with a history of large mass, dorsal aspect of the left wrist.  She is desirous of having this excised.  Pre, peri, and postoperative course have been discussed along with risks and complications.  She is aware that there is no guarantee with the surgery; possibility of infection; recurrence of injury to arteries, nerves, tendons; incomplete relief of symptoms and dystrophy. In the preoperative area, the patient is seen, the extremity marked by both the patient and surgeon, and antibiotic given.  PROCEDURE:  The patient was brought to the operating room where general anesthetic was carried out without difficulty.  She was prepped using ChloraPrep, supine position, left arm free.  A 3-minute dry time was allowed, time-out taken, confirming the patient and procedure.  A transverse incision was made over the dorsal aspect of her left wrist, carried down through the subcutaneous tissue.  Bleeders were electrocauterized.  The mass was immediately encountered.  Neurovascular structures were protected.  With blunt and sharp dissection, the extensor retinaculum was split, the cyst was then dissected free from surrounding tissue protecting the extensor tendons, this was followed down into the radiocarpal joint.  The joint was opened.  The  cyst was easily identified.  The stalk was noted to egress from the scapholunate ligament area.  The capsule was opened.  The cyst was excised in total and sent to Pathology.  A second cyst was present directly in the substance of the dorsal scapholunate ligament with meticulous dissection, this was dissected free preserving the dorsal ligament.  The specimen was sent to Pathology.  The wound was copiously irrigated with saline.  The capsule was then repaired with figure-of-eight 4-0 Vicryl sutures, the retinaculum with interrupted 4-0 Vicryl, the subcutaneous tissue with interrupted 4-0 Vicryl, and the skin with subcuticular 4-0 Vicryl Rapide suture.  A local infiltration with 0.25% Marcaine without epinephrine was given, 6 mL was used.  Sterile compressive dressing and volar wrist splint applied with the fingers free.  On deflation of the tourniquet, all fingers were immediately pinked.  She was taken to the recovery room for observation.          ______________________________ Cindee Salt, M.D.     GK/MEDQ  D:  06/30/2012  T:  07/01/2012  Job:  [id number]

## 2012-12-07 IMAGING — US US ABDOMEN COMPLETE
1 series · 14 of 25 positions shown · non-contrast
Comparison: None.

CLINICAL DATA: Abdominal pain, vomiting, diarrhea

COMPLETE ABDOMINAL ULTRASOUND

[Series 1: us abdomen complete · 0.32mm/px · 14 of 55 slices shown]
[im 1/55]
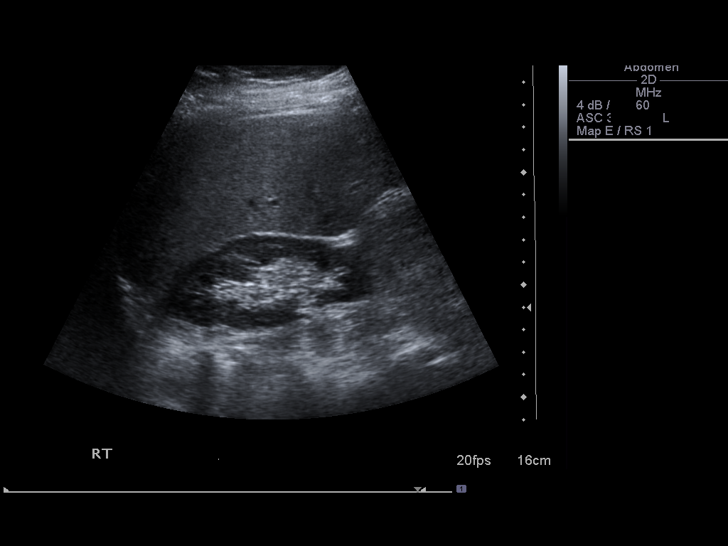
[im 5/55]
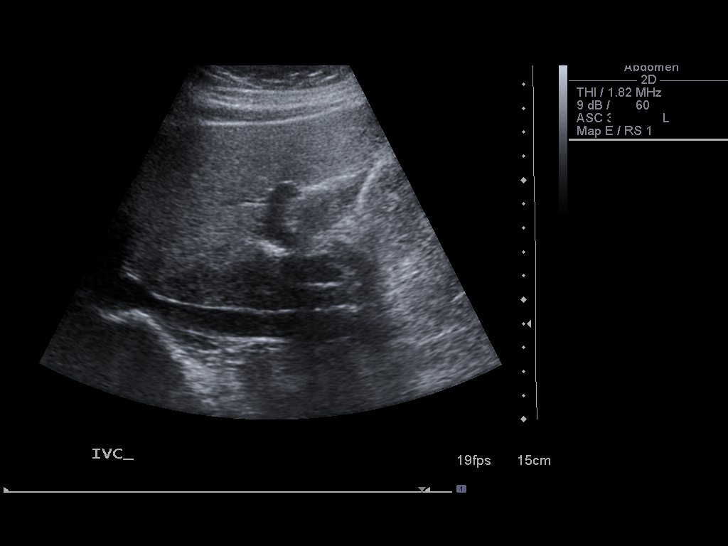
[im 10/55]
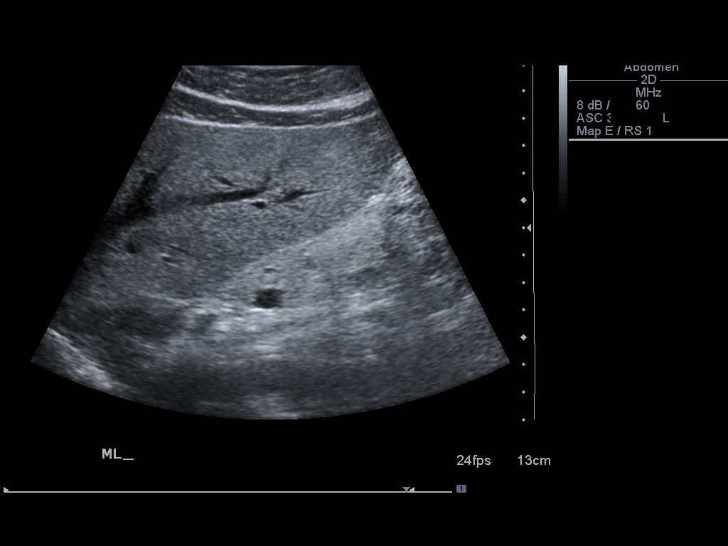
[im 14/55]
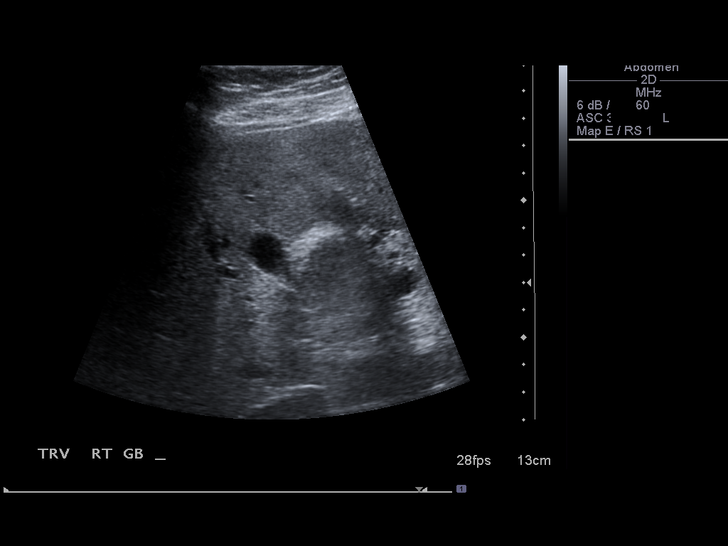
[im 19/55]
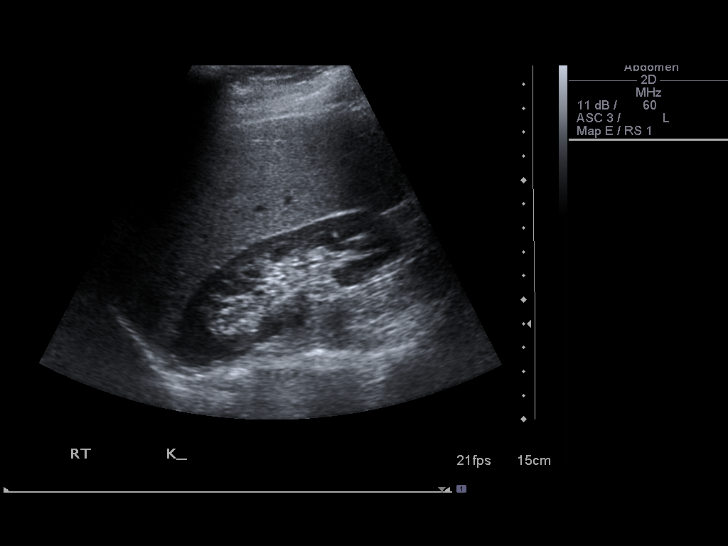
[im 21/55]
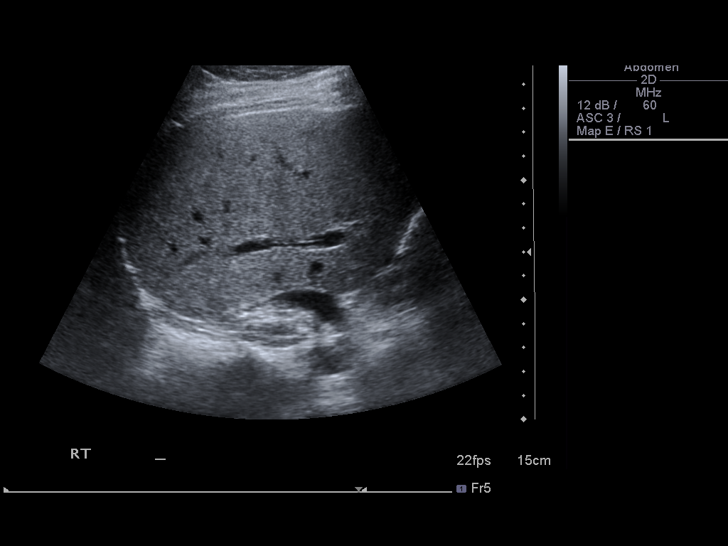
[im 25/55]
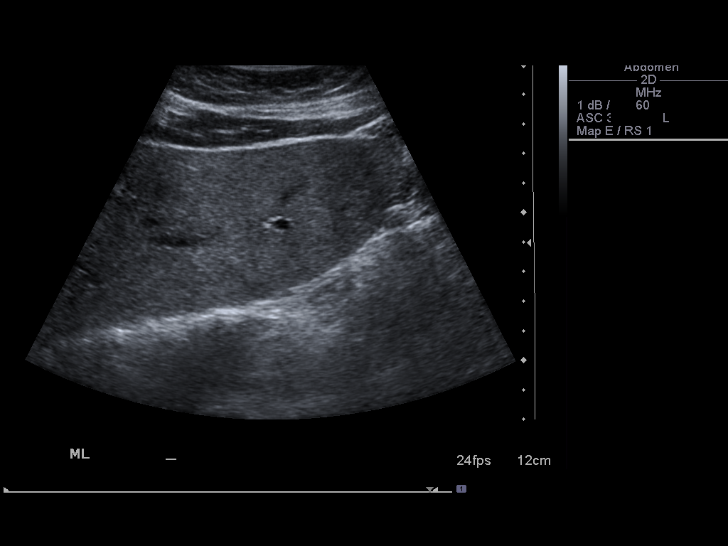
[im 30/55]
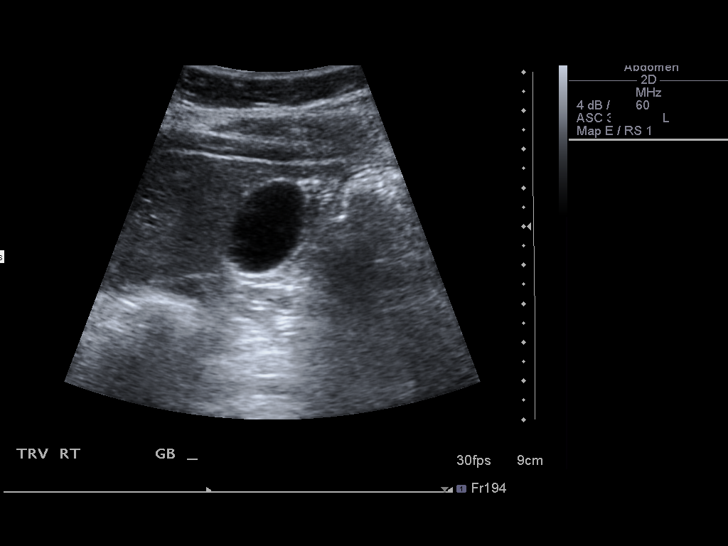
[im 34/55]
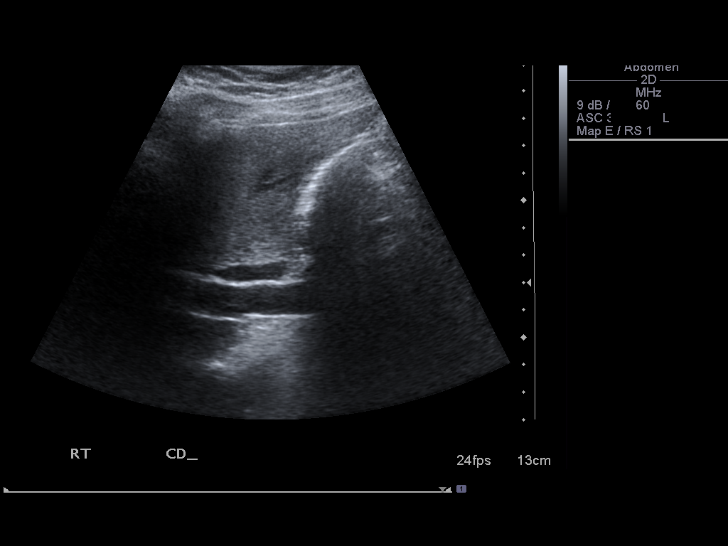
[im 37/55]
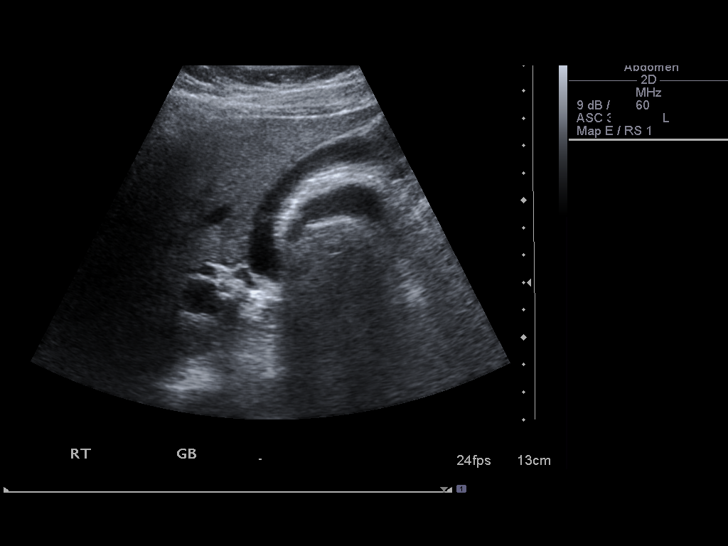
[im 41/55]
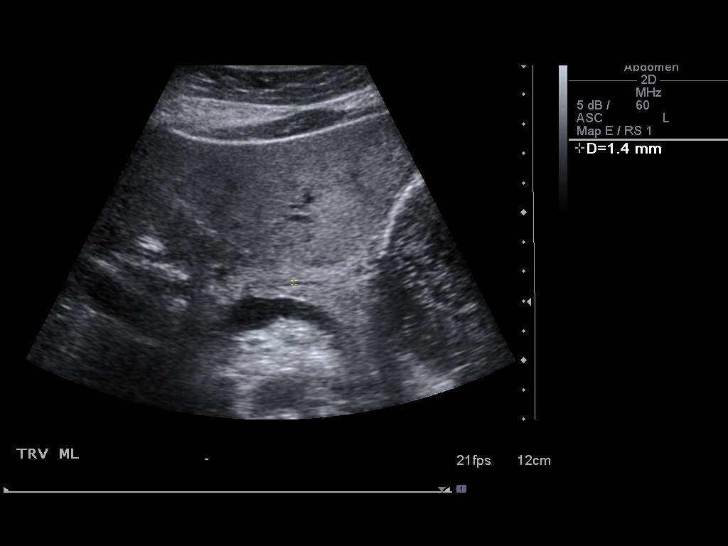
[im 46/55]
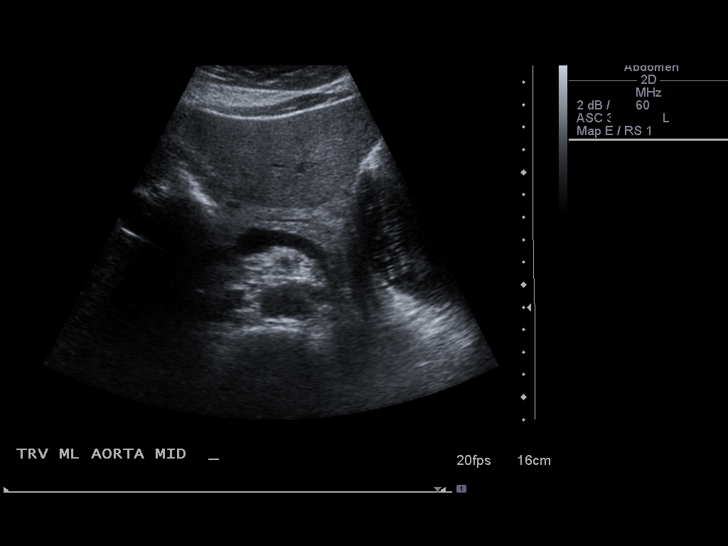
[im 50/55]
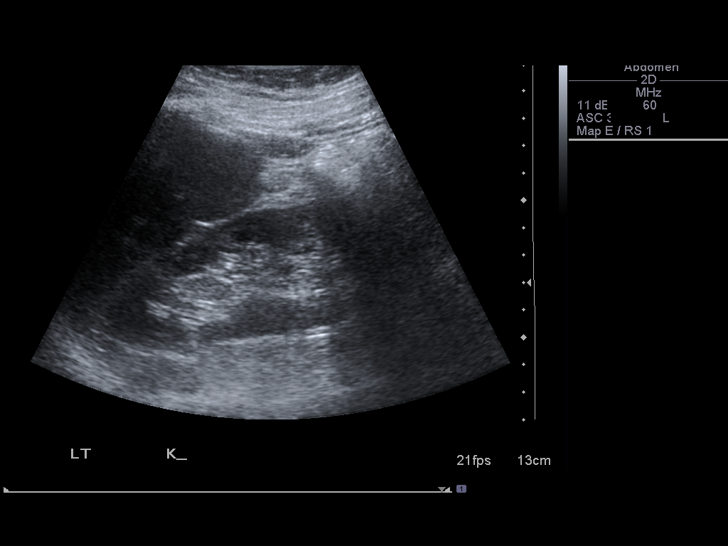
[im 55/55]
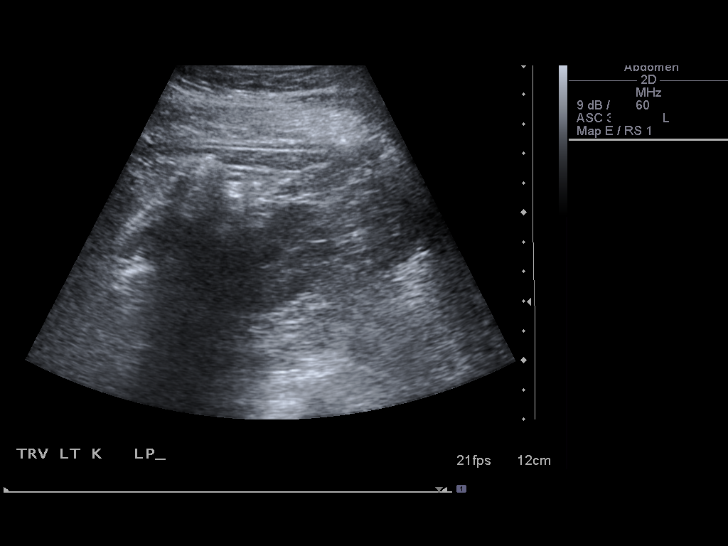

[14 of 25 positions shown; findings below may reference images not displayed]

FINDINGS: Gallbladder:  No gallstones, gallbladder wall thickening, or
pericholecystic fluid. No sonographic Murphy's sign.

Common bile duct:  8easuresG millimeters in diameter.

Liver:  No focal lesion identified.  Within normal limits in
parenchymal echogenicity.

IVC:  Appears normal.

Pancreas:  No focal abnormality seen.

Spleen:  Measures 8 cm in length.  Normal echogenicity.

Right Kidney:  Measures 11 cm in length.  No hydronephrosis, mass
or diagnostic renal calculus.

Left Kidney:  Measures 10.5 cm in length.  No mass, hydronephrosis
or diagnostic renal calculus.  Limited visualization of the lower
pole due to abundant bowel gas.

Abdominal aorta:  No aneurysm identified.  Measures up to 1.9 cm in
diameter.  The exam is limited due to abundant bowel gas.
IMPRESSION: Negative abdominal ultrasound.  Suboptimal examination as described
above.

## 2013-02-22 ENCOUNTER — Other Ambulatory Visit: Payer: Self-pay

## 2013-02-22 DIAGNOSIS — Z1231 Encounter for screening mammogram for malignant neoplasm of breast: Secondary | ICD-10-CM

## 2013-03-18 ENCOUNTER — Ambulatory Visit: Payer: BC Managed Care – PPO

## 2013-03-26 ENCOUNTER — Ambulatory Visit
Admission: RE | Admit: 2013-03-26 | Discharge: 2013-03-26 | Disposition: A | Discharge status: Home / Self Care | Payer: BC Managed Care – PPO | Source: Ambulatory Visit

## 2013-03-26 DIAGNOSIS — Z1231 Encounter for screening mammogram for malignant neoplasm of breast: Secondary | ICD-10-CM

## 2013-11-11 HISTORY — PX: SMALL INTESTINE SURGERY: SHX150

## 2015-03-24 ENCOUNTER — Encounter: Payer: Self-pay | Admitting: Gastroenterology

## 2016-03-18 ENCOUNTER — Encounter: Payer: Self-pay | Admitting: Gastroenterology

## 2018-11-10 DIAGNOSIS — F339 Major depressive disorder, recurrent, unspecified: Secondary | ICD-10-CM | POA: Insufficient documentation

## 2019-07-19 DIAGNOSIS — E7800 Pure hypercholesterolemia, unspecified: Secondary | ICD-10-CM | POA: Insufficient documentation

## 2019-07-19 DIAGNOSIS — M47817 Spondylosis without myelopathy or radiculopathy, lumbosacral region: Secondary | ICD-10-CM | POA: Insufficient documentation

## 2019-07-19 DIAGNOSIS — E876 Hypokalemia: Secondary | ICD-10-CM | POA: Insufficient documentation

## 2019-07-19 DIAGNOSIS — Z720 Tobacco use: Secondary | ICD-10-CM | POA: Insufficient documentation

## 2019-07-19 DIAGNOSIS — K219 Gastro-esophageal reflux disease without esophagitis: Secondary | ICD-10-CM | POA: Insufficient documentation

## 2019-07-19 DIAGNOSIS — N951 Menopausal and female climacteric states: Secondary | ICD-10-CM | POA: Insufficient documentation

## 2019-07-19 DIAGNOSIS — E860 Dehydration: Secondary | ICD-10-CM | POA: Insufficient documentation

## 2019-08-02 HISTORY — PX: SMALL INTESTINE SURGERY: SHX150

## 2019-12-20 DIAGNOSIS — I340 Nonrheumatic mitral (valve) insufficiency: Secondary | ICD-10-CM

## 2020-01-21 ENCOUNTER — Other Ambulatory Visit (INDEPENDENT_AMBULATORY_CARE_PROVIDER_SITE_OTHER): Payer: BC Managed Care – PPO

## 2020-01-21 ENCOUNTER — Other Ambulatory Visit: Payer: Self-pay

## 2020-01-21 ENCOUNTER — Ambulatory Visit: Payer: BC Managed Care – PPO | Admitting: Neurology

## 2020-01-21 ENCOUNTER — Encounter: Payer: Self-pay | Admitting: Neurology

## 2020-01-21 VITALS — BP 158/67 | HR 87 | Ht 65.0 in | Wt 139.0 lb

## 2020-01-21 DIAGNOSIS — G5712 Meralgia paresthetica, left lower limb: Secondary | ICD-10-CM

## 2020-01-21 DIAGNOSIS — G5621 Lesion of ulnar nerve, right upper limb: Secondary | ICD-10-CM | POA: Diagnosis not present

## 2020-01-21 DIAGNOSIS — R202 Paresthesia of skin: Secondary | ICD-10-CM

## 2020-01-21 DIAGNOSIS — F320 Major depressive disorder, single episode, mild: Secondary | ICD-10-CM | POA: Insufficient documentation

## 2020-01-21 DIAGNOSIS — L290 Pruritus ani: Secondary | ICD-10-CM | POA: Insufficient documentation

## 2020-01-21 DIAGNOSIS — F32A Depression, unspecified: Secondary | ICD-10-CM | POA: Insufficient documentation

## 2020-01-21 DIAGNOSIS — Z780 Asymptomatic menopausal state: Secondary | ICD-10-CM | POA: Insufficient documentation

## 2020-01-21 NOTE — Patient Instructions (Addendum)
Check labs Your provider has requested that you have labwork completed today. Please go to [facility] ([id number]) on the second floor of this building before leaving the office today. You do not need to check in. If you are not called within 15 minutes please check with the front desk.   Avoid over bending at the right elbow and minimize compression of the nerve If your face tingling returns, call the office and we can order MRI

## 2020-01-21 NOTE — Progress Notes (Signed)
[facility] Clinic Note - Initial Visit   Date: 01/21/20  Kimberly Pacheco MRN: [id number] DOB: May 07, 1961   Dear Dr. Wendie Agreste:  Thank you for your kind referral of Kimberly Pacheco for consultation of paresthesias. Although her history is well known to you, please allow [place] to reiterate it for the purpose of our medical record. The patient was accompanied to the clinic by self.   History of Present Illness: Kimberly Pacheco is a 59 y.o. right-handed Pacheco with hypothyroidism, GERD, and depression presenting for evaluation of paresthesias.   In December, she was having spells of  numbness/tingling over the left jaw, lasting a few minutes, occurring at night time. Symptoms continued until February 2021 and has since resolved.  No associated facial weakness or vision changes.  After facial numbness improved, she developed right arm numbness, which is worse in the morning when she wakes up. Upon further questioning, she admits to often laying with her right arm flexed and supported on her elbow, she has noticed this is when her hand will tingling.  She does not have any weakness of the right arm. .  In November, she had cellulitis over both legs which was treated with antibiotics.  Since this time, she developed numbness over the left lateral thigh, which has remained constant. No associated weakness.   Past Medical History:  Diagnosis Date  . Arthritis   . Depression   . Duodenal ulcer   . GERD (gastroesophageal reflux disease)    off meds  . Hypothyroid     Past Surgical History:  Procedure Laterality Date  . COLONOSCOPY  ?[place]  . GANGLION CYST EXCISION  06/30/2012   Procedure: REMOVAL GANGLION OF WRIST;  Surgeon: Wynonia Sours, MD;  Location: [facility];  Service: Orthopedics;  Laterality: Left;  . SMALL INTESTINE SURGERY  08/02/2019  . SMALL INTESTINE SURGERY  2015  . TUBAL LIGATION    . UPPER  GASTROINTESTINAL ENDOSCOPY     WITH DR.STARK     Medications:  Outpatient Encounter Medications as of 01/21/2020  Medication Sig  . levothyroxine (SYNTHROID, LEVOTHROID) 112 MCG tablet Take 112 mcg by mouth daily.  . ondansetron (ZOFRAN-ODT) 8 MG disintegrating tablet   . sertraline (ZOLOFT) 50 MG tablet Take 100 mg by mouth at bedtime.   Marland Kitchen omeprazole (PRILOSEC) 20 MG capsule daily.  . Probiotic Product (ALIGN PO) Take by mouth.  . [DISCONTINUED] estradiol (VIVELLE-DOT) 0.05 MG/24HR Place 1 patch onto the skin once a week.    . [DISCONTINUED] progesterone (PROMETRIUM) 200 MG capsule Take 200 mg by mouth as directed.    . [DISCONTINUED] rosuvastatin (CRESTOR) 5 MG tablet Take 5 mg by mouth daily.    No facility-administered encounter medications on file as of 01/21/2020.    Allergies: No Known Allergies  Family History: Family History  Problem Relation Age of Onset  . Colon polyps Mother   . Diabetes Father   . Colon cancer Neg Hx     Social History: Social History   Tobacco Use  . Smoking status: Current Every Day Smoker    Packs/day: 0.50  . Smokeless tobacco: Never Used  Substance Use Topics  . Alcohol use: No  . Drug use: No   Social History   Social History Narrative   Married   Right handed    Lives in a single story home with Husband    Vital Signs:  BP (!) 158/67   Pulse  87   Ht 5\' 5"  (1.651 m)   Wt 139 lb (63 kg)   SpO2 99%   BMI 23.13 kg/m    Neurological Exam: MENTAL STATUS including orientation to time, place, person, recent and remote memory, attention span and concentration, language, and fund of knowledge is normal.  Speech is not dysarthric.  CRANIAL NERVES: II:  No visual field defects.   III-IV-VI: Pupils equal round and reactive to light.  Normal conjugate, extra-ocular eye movements in all directions of gaze.  No nystagmus.  No ptosis.   V:  Normal facial sensation.    VII:  Normal facial symmetry and movements.   VIII:  Normal  hearing and vestibular function.   IX-X:  Normal palatal movement.   XI:  Normal shoulder shrug and head rotation.   XII:  Normal tongue strength and range of motion, no deviation or fasciculation.  MOTOR:  No atrophy, fasciculations or abnormal movements.  No pronator drift.   Upper Extremity:  Right  Left  Deltoid  5/5   5/5   Biceps  5/5   5/5   Triceps  5/5   5/5   Infraspinatus 5/5  5/5  Medial pectoralis 5/5  5/5  Wrist extensors  5/5   5/5   Wrist flexors  5/5   5/5   Finger extensors  5/5   5/5   Finger flexors  5/5   5/5   Dorsal interossei  5/5   5/5   Abductor pollicis  5/5   5/5   Tone (Ashworth scale)  0  0   Lower Extremity:  Right  Left  Hip flexors  5/5   5/5   Hip extensors  5/5   5/5   Adductor 5/5  5/5  Abductor 5/5  5/5  Knee flexors  5/5   5/5   Knee extensors  5/5   5/5   Dorsiflexors  5/5   5/5   Plantarflexors  5/5   5/5   Toe extensors  5/5   5/5   Toe flexors  5/5   5/5   Tone (Ashworth scale)  0  0   MSRs:  Right        Left                  brachioradialis 2+  2+  biceps 2+  2+  triceps 2+  2+  patellar 2+  2+  ankle jerk 2+  2+  Hoffman no  no  plantar response down  down   SENSORY:  Mildly reduced temperature over the left lateral distal thigh.  Otherwise, normal and symmetric perception of light touch, pinprick, vibration, and proprioception.  Romberg's sign absent.   COORDINATION/GAIT: Normal finger-to- nose-finger and heel-to-shin.  Intact rapid alternating movements bilaterally.  Able to rise from a chair without using arms.  Gait narrow based and stable. Tandem and stressed gait intact.    IMPRESSION: 1.  Left facial paresthesias, resolved.  No recurrence of symptoms.  Given that symptoms were always stereotyped and lasting a few minutes at night time, less likely to represent TIA.  Advised patient to monitor and if it recurs, MRI brain can ben ordered.  Check vitamin B12 and TSH.  2.  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  Strategies to minimize nerve compression and avoid over stretching at the elbow was discussed.  NCS/EMG declined and she will reconsider if symptoms get worse.  3.  Left meralgia paresthetica  causing left thigh numbness.  Explained that she probably has nerve entrapment at the ASIS causing symptoms and symptoms may improve with time.  If she does not, lasting deficits would only cause numbness over the lateral thigh.  Reassurance provided.      Thank you for allowing me to participate in patient's care.  If I can answer any additional questions, I would be pleased to do so.    Sincerely,    Kweku Stankey K. Posey Pronto, DO

## 2020-01-24 ENCOUNTER — Other Ambulatory Visit: Payer: Self-pay

## 2020-01-24 DIAGNOSIS — R202 Paresthesia of skin: Secondary | ICD-10-CM

## 2020-01-24 DIAGNOSIS — G5712 Meralgia paresthetica, left lower limb: Secondary | ICD-10-CM

## 2020-01-24 DIAGNOSIS — G5621 Lesion of ulnar nerve, right upper limb: Secondary | ICD-10-CM

## 2020-01-25 LAB — TEST AUTHORIZATION

## 2020-01-25 LAB — T4, FREE: Free T4: 1.3 ng/dL (ref 0.8–1.8)

## 2020-01-25 LAB — TSH: TSH: 0.06 mIU/L — ABNORMAL LOW (ref 0.40–4.50)

## 2020-01-25 LAB — VITAMIN B12: Vitamin B-12: 254 pg/mL (ref 200–1100)

## 2020-01-25 LAB — T3, FREE: T3, Free: 3.3 pg/mL (ref 2.3–4.2)
